# Patient Record
Sex: Female | Born: 1960 | Race: White | Hispanic: No | Marital: Married
Health system: Southern US, Community
[De-identification: ages and names within clinical notes are randomized; demographics above are authoritative.]

## PROBLEM LIST (undated history)

## (undated) DIAGNOSIS — C801 Malignant (primary) neoplasm, unspecified: Secondary | ICD-10-CM

---

## 1998-05-24 ENCOUNTER — Other Ambulatory Visit: Admission: RE | Admit: 1998-05-24 | Discharge: 1998-05-24 | Payer: Self-pay | Admitting: *Deleted

## 1998-06-27 ENCOUNTER — Other Ambulatory Visit: Admission: RE | Admit: 1998-06-27 | Discharge: 1998-06-27 | Payer: Self-pay | Admitting: *Deleted

## 1999-01-21 ENCOUNTER — Other Ambulatory Visit: Admission: RE | Admit: 1999-01-21 | Discharge: 1999-01-21 | Payer: Self-pay | Admitting: *Deleted

## 2004-02-29 ENCOUNTER — Ambulatory Visit: Payer: Self-pay | Admitting: Obstetrics and Gynecology

## 2005-05-27 ENCOUNTER — Ambulatory Visit: Payer: Self-pay | Admitting: Obstetrics and Gynecology

## 2006-06-01 ENCOUNTER — Ambulatory Visit: Payer: Self-pay | Admitting: Obstetrics and Gynecology

## 2007-06-22 ENCOUNTER — Ambulatory Visit: Payer: Self-pay | Admitting: Obstetrics and Gynecology

## 2007-06-25 ENCOUNTER — Ambulatory Visit: Payer: Self-pay | Admitting: Obstetrics and Gynecology

## 2008-06-22 ENCOUNTER — Ambulatory Visit: Payer: Self-pay | Admitting: Obstetrics and Gynecology

## 2009-06-27 ENCOUNTER — Ambulatory Visit: Payer: Self-pay | Admitting: Obstetrics and Gynecology

## 2010-07-02 ENCOUNTER — Ambulatory Visit: Payer: Self-pay | Admitting: Obstetrics and Gynecology

## 2011-07-17 ENCOUNTER — Ambulatory Visit: Payer: Self-pay | Admitting: Obstetrics and Gynecology

## 2012-04-02 ENCOUNTER — Ambulatory Visit: Payer: Self-pay | Admitting: Orthopedic Surgery

## 2012-07-12 ENCOUNTER — Ambulatory Visit: Payer: Self-pay | Admitting: Family Medicine

## 2012-07-20 ENCOUNTER — Ambulatory Visit: Payer: Self-pay | Admitting: Obstetrics and Gynecology

## 2013-08-18 ENCOUNTER — Ambulatory Visit: Payer: Self-pay | Admitting: Obstetrics and Gynecology

## 2014-06-20 ENCOUNTER — Other Ambulatory Visit: Payer: Self-pay | Admitting: Obstetrics and Gynecology

## 2014-06-20 DIAGNOSIS — Z1231 Encounter for screening mammogram for malignant neoplasm of breast: Secondary | ICD-10-CM

## 2014-08-21 ENCOUNTER — Ambulatory Visit
Admission: RE | Admit: 2014-08-21 | Discharge: 2014-08-21 | Disposition: A | Discharge status: Home / Self Care | Payer: 59 | Source: Ambulatory Visit | Attending: Obstetrics and Gynecology | Admitting: Obstetrics and Gynecology

## 2014-08-21 DIAGNOSIS — Z1231 Encounter for screening mammogram for malignant neoplasm of breast: Secondary | ICD-10-CM | POA: Insufficient documentation

## 2014-08-21 HISTORY — DX: Malignant (primary) neoplasm, unspecified: C80.1

## 2015-08-31 ENCOUNTER — Other Ambulatory Visit: Payer: Self-pay | Admitting: Obstetrics and Gynecology

## 2015-08-31 DIAGNOSIS — Z1231 Encounter for screening mammogram for malignant neoplasm of breast: Secondary | ICD-10-CM

## 2015-09-20 ENCOUNTER — Ambulatory Visit
Admission: RE | Admit: 2015-09-20 | Discharge: 2015-09-20 | Disposition: A | Discharge status: Home / Self Care | Payer: 59 | Source: Ambulatory Visit | Attending: Obstetrics and Gynecology | Admitting: Obstetrics and Gynecology

## 2015-09-20 ENCOUNTER — Other Ambulatory Visit: Payer: Self-pay | Admitting: Obstetrics and Gynecology

## 2015-09-20 DIAGNOSIS — Z1231 Encounter for screening mammogram for malignant neoplasm of breast: Secondary | ICD-10-CM | POA: Diagnosis not present

## 2017-09-15 ENCOUNTER — Other Ambulatory Visit: Payer: Self-pay | Admitting: Obstetrics and Gynecology

## 2017-09-15 DIAGNOSIS — Z1231 Encounter for screening mammogram for malignant neoplasm of breast: Secondary | ICD-10-CM

## 2017-09-16 ENCOUNTER — Ambulatory Visit
Admission: RE | Admit: 2017-09-16 | Discharge: 2017-09-16 | Disposition: A | Discharge status: Home / Self Care | Source: Ambulatory Visit | Attending: Obstetrics and Gynecology | Admitting: Obstetrics and Gynecology

## 2017-09-16 ENCOUNTER — Encounter (INDEPENDENT_AMBULATORY_CARE_PROVIDER_SITE_OTHER): Payer: Self-pay

## 2017-09-16 DIAGNOSIS — Z1231 Encounter for screening mammogram for malignant neoplasm of breast: Secondary | ICD-10-CM | POA: Diagnosis present

## 2019-01-10 ENCOUNTER — Other Ambulatory Visit: Payer: Self-pay | Admitting: Obstetrics and Gynecology

## 2019-01-10 DIAGNOSIS — Z1231 Encounter for screening mammogram for malignant neoplasm of breast: Secondary | ICD-10-CM

## 2019-01-12 ENCOUNTER — Other Ambulatory Visit: Payer: Self-pay

## 2019-01-12 ENCOUNTER — Ambulatory Visit
Admission: RE | Admit: 2019-01-12 | Discharge: 2019-01-12 | Disposition: A | Discharge status: Home / Self Care | Source: Ambulatory Visit | Attending: Obstetrics and Gynecology | Admitting: Obstetrics and Gynecology

## 2019-01-12 DIAGNOSIS — Z1231 Encounter for screening mammogram for malignant neoplasm of breast: Secondary | ICD-10-CM | POA: Insufficient documentation

## 2019-05-30 ENCOUNTER — Ambulatory Visit: Attending: Internal Medicine

## 2019-05-30 DIAGNOSIS — Z23 Encounter for immunization: Secondary | ICD-10-CM

## 2019-05-30 NOTE — Progress Notes (Signed)
   Covid-19 Vaccination Clinic  Name:  EVAMARIE BLUTH    MRN: [id number] DOB: 12-14-1960  05/30/2019  Ms. Granieri was observed post Covid-19 immunization for 15 minutes without incident. She was provided with Vaccine Information Sheet and instruction to access the V-Safe system.   Ms. [place] was instructed to call 911 with any severe reactions post vaccine: Marland Kitchen Difficulty breathing  . Swelling of face and throat  . A fast heartbeat  . A bad rash all over body  . Dizziness and weakness   Immunizations Administered    Name Date Dose VIS Date Route   Pfizer COVID-19 Vaccine 05/30/2019  6:31 PM 0.3 mL 02/04/2019 Intramuscular   Manufacturer: [place]   Lot: [id number]   [place]: [id number]

## 2019-06-20 ENCOUNTER — Ambulatory Visit: Attending: Internal Medicine

## 2019-06-20 DIAGNOSIS — Z23 Encounter for immunization: Secondary | ICD-10-CM

## 2019-06-20 NOTE — Progress Notes (Signed)
   Covid-19 Vaccination Clinic  Name:  Veronica Hammond    MRN: [id number] DOB: 09/17/1960  06/20/2019  [name] was observed post Covid-19 immunization for 15 minutes without incident. She was provided with Vaccine Information Sheet and instruction to access the V-Safe system.   [name] was instructed to call 911 with any severe reactions post vaccine: Marland Kitchen Difficulty breathing  . Swelling of face and throat  . A fast heartbeat  . A bad rash all over body  . Dizziness and weakness   Immunizations Administered    Name Date Dose VIS Date Route   Pfizer COVID-19 Vaccine 06/20/2019  6:44 PM 0.3 mL 04/20/2018 Intramuscular   Manufacturer: [employer], [employer]   Lot: [id number]   [place]: [id number]

## 2020-02-03 ENCOUNTER — Other Ambulatory Visit: Payer: Self-pay | Admitting: Gerontology

## 2020-02-03 DIAGNOSIS — Z1231 Encounter for screening mammogram for malignant neoplasm of breast: Secondary | ICD-10-CM

## 2020-02-20 ENCOUNTER — Ambulatory Visit: Attending: Internal Medicine

## 2020-02-20 DIAGNOSIS — Z23 Encounter for immunization: Secondary | ICD-10-CM

## 2020-02-20 NOTE — Progress Notes (Signed)
   Covid-19 Vaccination Clinic  Name:  Veronica Hammond    MRN: [id number] DOB: March 28, 1960  02/20/2020  [name] was observed post Covid-19 immunization for 15 minutes without incident. She was provided with Vaccine Information Sheet and instruction to access the V-Safe system.   [name] was instructed to call 911 with any severe reactions post vaccine: Marland Kitchen Difficulty breathing  . Swelling of face and throat  . A fast heartbeat  . A bad rash all over body  . Dizziness and weakness   Immunizations Administered    Name Date Dose VIS Date Route   Pfizer COVID-19 Vaccine 02/20/2020  1:16 PM 0.3 mL 12/14/2019 Intramuscular   Manufacturer: [employer], [employer]   Lot: [id number]   [facility]: [address]-0

## 2020-04-11 ENCOUNTER — Other Ambulatory Visit: Payer: Self-pay

## 2020-04-11 ENCOUNTER — Ambulatory Visit
Admission: RE | Admit: 2020-04-11 | Discharge: 2020-04-11 | Disposition: A | Discharge status: Home / Self Care | Source: Ambulatory Visit | Attending: Gerontology | Admitting: Gerontology

## 2020-04-11 DIAGNOSIS — Z1231 Encounter for screening mammogram for malignant neoplasm of breast: Secondary | ICD-10-CM | POA: Insufficient documentation

## 2021-02-05 ENCOUNTER — Other Ambulatory Visit: Payer: Self-pay | Admitting: Gerontology

## 2021-02-05 DIAGNOSIS — Z1231 Encounter for screening mammogram for malignant neoplasm of breast: Secondary | ICD-10-CM

## 2021-04-04 LAB — EXTERNAL GENERIC LAB PROCEDURE: COLOGUARD: NEGATIVE

## 2021-04-04 IMAGING — MG MM DIGITAL SCREENING BILAT W/ TOMO AND CAD
8 series · 8 of 24 positions shown · non-contrast
Comparison: Previous exam(s).

CLINICAL DATA: Screening.

EXAM:
DIGITAL SCREENING BILATERAL MAMMOGRAM WITH TOMOSYNTHESIS AND CAD
TECHNIQUE: Bilateral screening digital craniocaudal and mediolateral oblique
mammograms were obtained. Bilateral screening digital breast
tomosynthesis was performed. The images were evaluated with
computer-aided detection.

[R MLO synth-2D]
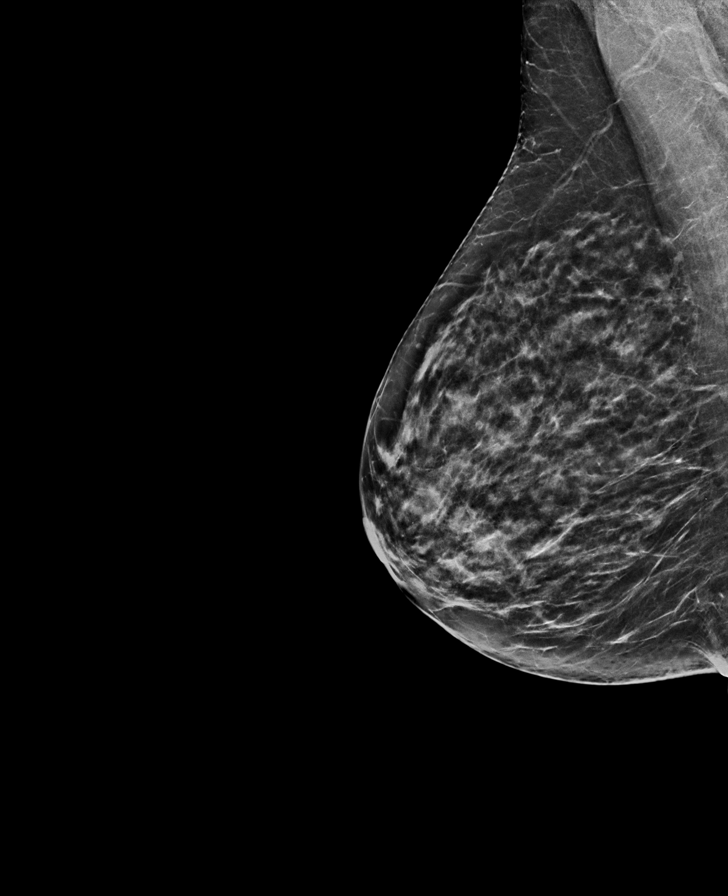

[L CC synth-2D]
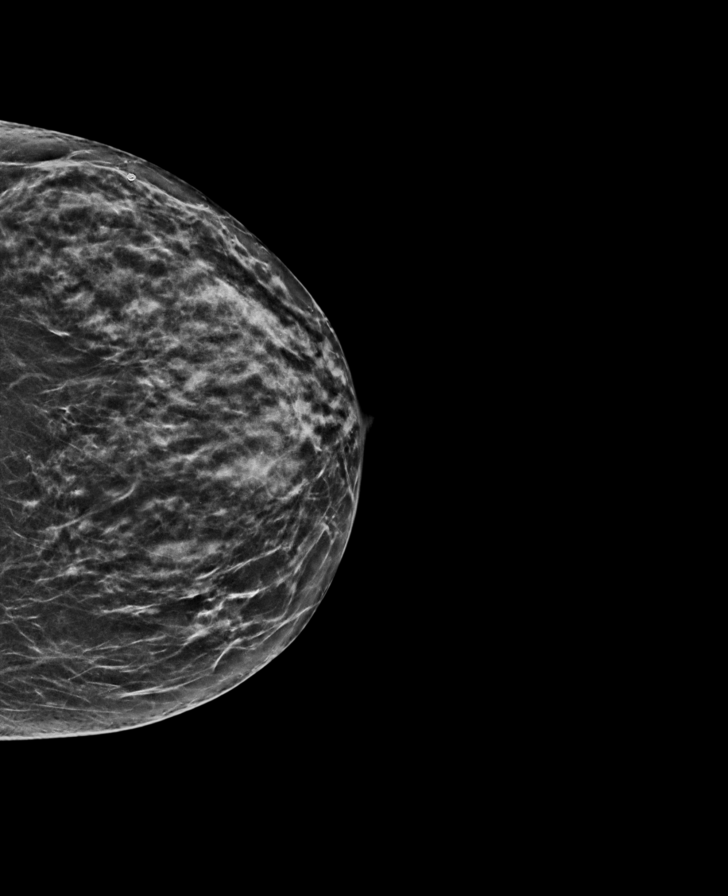

[R CC synth-2D]
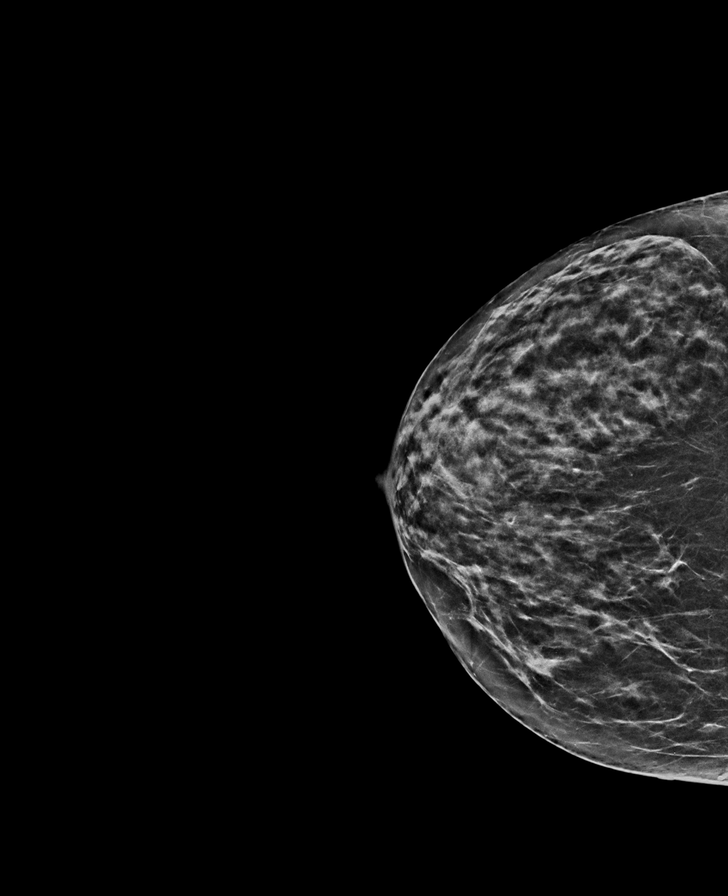

[L MLO synth-2D]
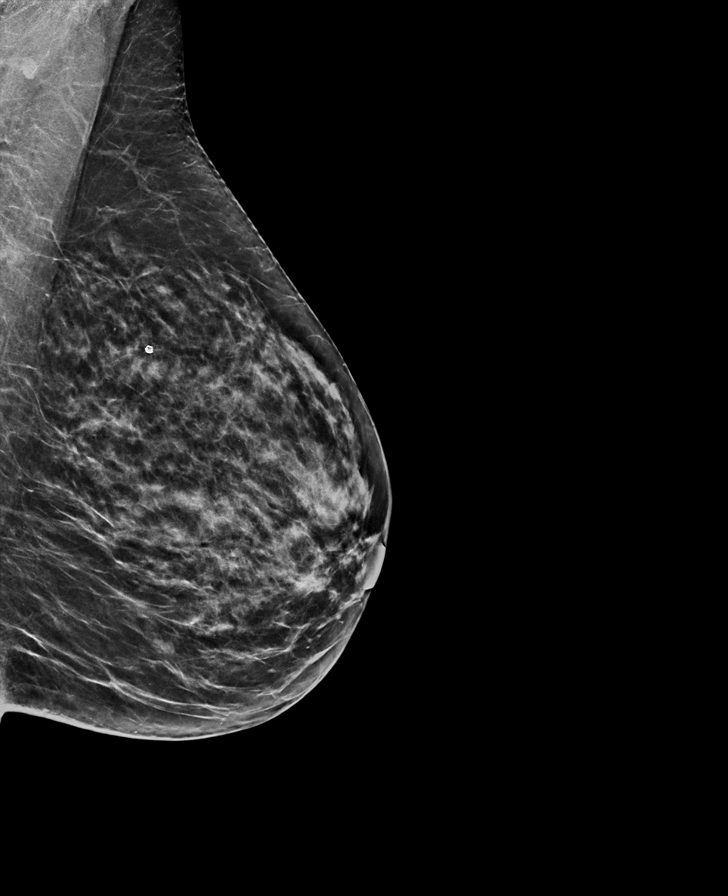

[R MLO tomo · tomo slice 31/60.0]
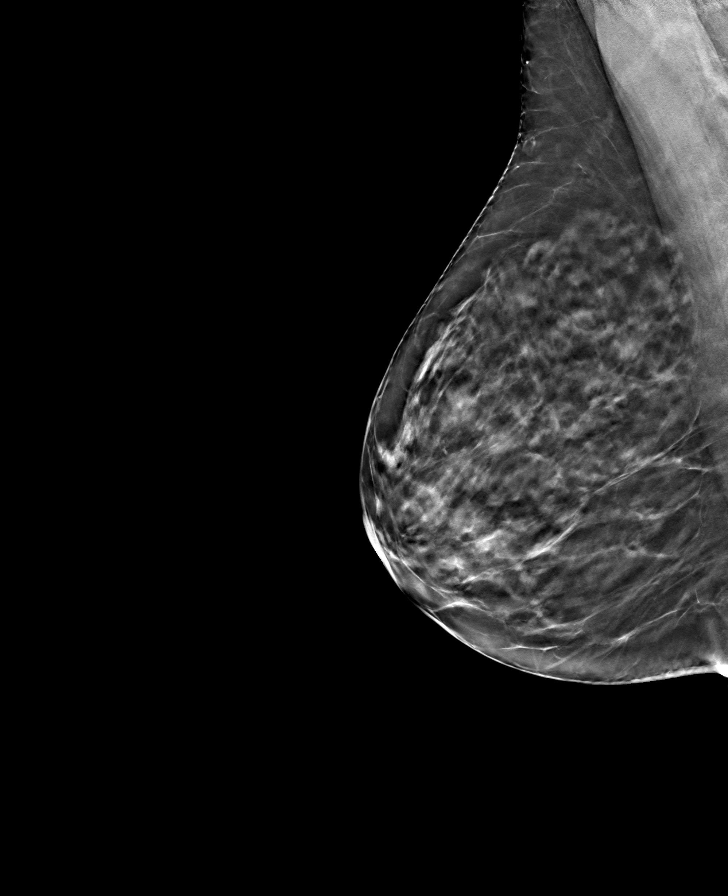

[L CC tomo · tomo slice 29/57.0]
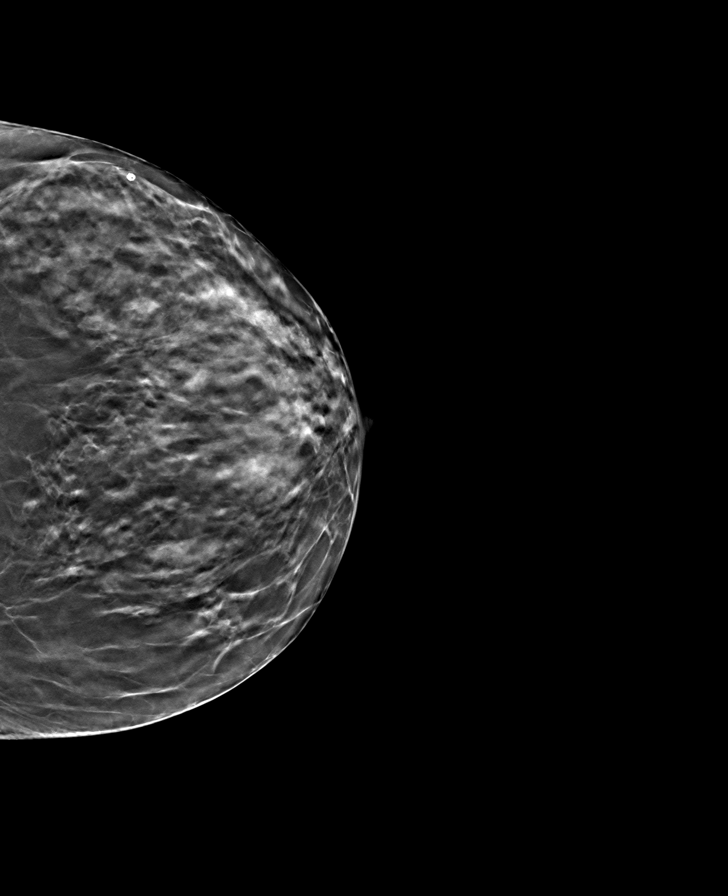

[L MLO tomo · tomo slice 31/61.0]
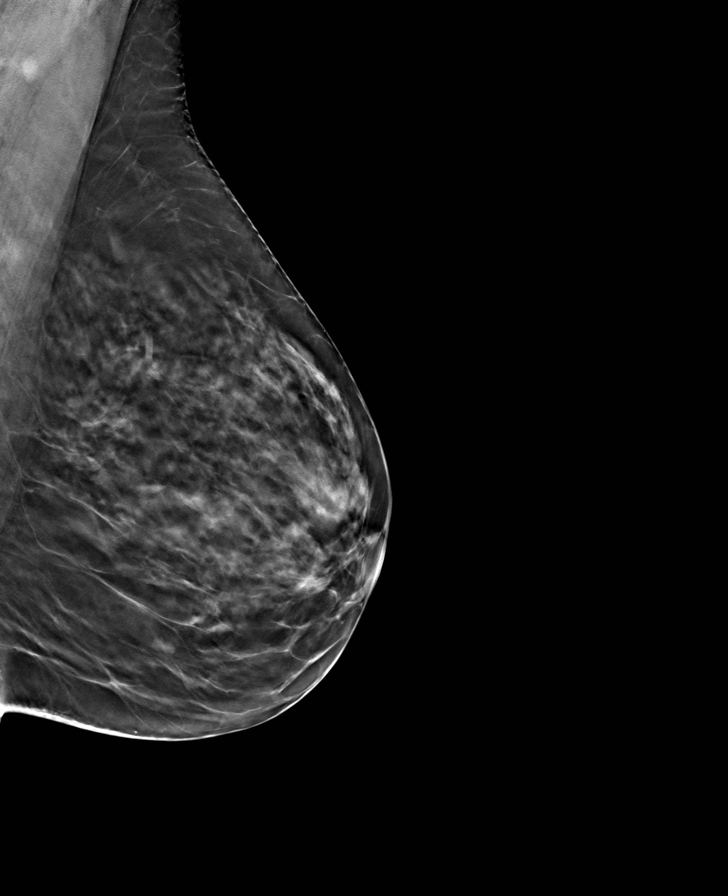

[R CC tomo · tomo slice 29/56.0]
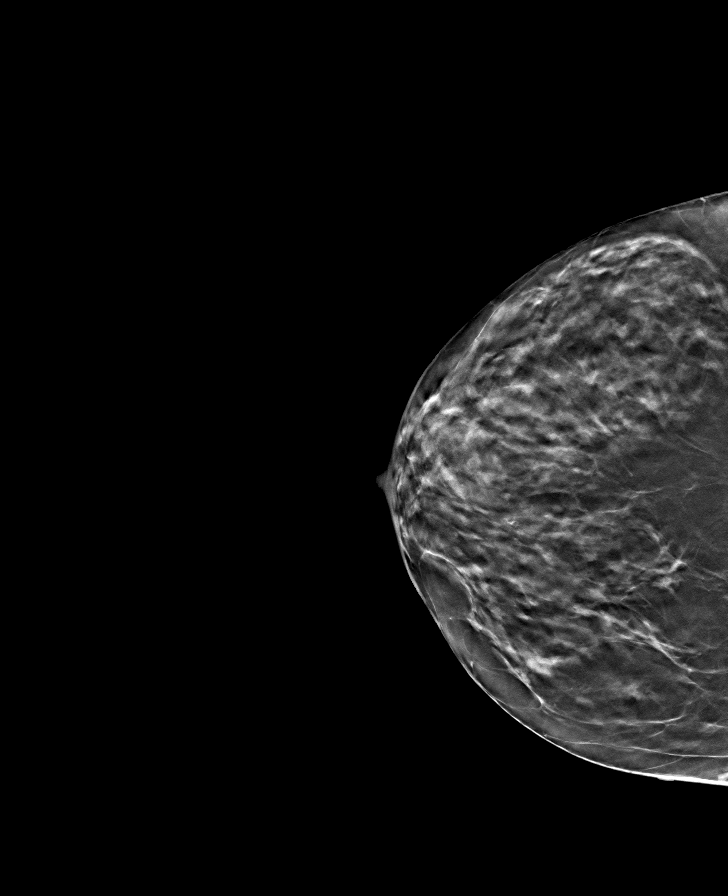

[8 of 24 positions shown; findings below may reference images not displayed]

ACR Breast Density Category c: The breast tissue is heterogeneously
dense, which may obscure small masses.
FINDINGS: There are no findings suspicious for malignancy.
IMPRESSION: No mammographic evidence of malignancy. A result letter of this
screening mammogram will be mailed directly to the patient.

RECOMMENDATION:
Screening mammogram in one year. (Code:Q3-W-BC3)

BI-RADS CATEGORY  1: Negative.

## 2021-05-14 ENCOUNTER — Ambulatory Visit
Admission: RE | Admit: 2021-05-14 | Discharge: 2021-05-14 | Disposition: A | Discharge status: Home / Self Care | Source: Ambulatory Visit | Attending: Gerontology | Admitting: Gerontology

## 2021-05-14 ENCOUNTER — Other Ambulatory Visit: Payer: Self-pay

## 2021-05-14 DIAGNOSIS — Z1231 Encounter for screening mammogram for malignant neoplasm of breast: Secondary | ICD-10-CM | POA: Diagnosis present

## 2022-02-07 ENCOUNTER — Other Ambulatory Visit: Payer: Self-pay | Admitting: Gerontology

## 2022-02-07 DIAGNOSIS — Z1231 Encounter for screening mammogram for malignant neoplasm of breast: Secondary | ICD-10-CM

## 2022-05-20 ENCOUNTER — Ambulatory Visit
Admission: RE | Admit: 2022-05-20 | Discharge: 2022-05-20 | Disposition: A | Discharge status: Home / Self Care | Source: Ambulatory Visit | Attending: Gerontology | Admitting: Gerontology

## 2022-05-20 DIAGNOSIS — Z1231 Encounter for screening mammogram for malignant neoplasm of breast: Secondary | ICD-10-CM | POA: Diagnosis not present

## 2023-05-05 ENCOUNTER — Other Ambulatory Visit: Payer: Self-pay | Admitting: Gerontology

## 2023-05-05 DIAGNOSIS — Z1231 Encounter for screening mammogram for malignant neoplasm of breast: Secondary | ICD-10-CM

## 2023-07-02 ENCOUNTER — Ambulatory Visit
Admission: RE | Admit: 2023-07-02 | Discharge: 2023-07-02 | Disposition: A | Discharge status: Home / Self Care | Source: Ambulatory Visit | Attending: Gerontology | Admitting: Gerontology

## 2023-07-02 DIAGNOSIS — Z1231 Encounter for screening mammogram for malignant neoplasm of breast: Secondary | ICD-10-CM | POA: Diagnosis present
